# Patient Record
Sex: Male | Born: 1990 | Race: White | Hispanic: No | Marital: Single | Smoking: Former smoker
Health system: Southern US, Community
[De-identification: ages and names within clinical notes are randomized; demographics above are authoritative.]

## PROBLEM LIST (undated history)

## (undated) DIAGNOSIS — J342 Deviated nasal septum: Secondary | ICD-10-CM

## (undated) HISTORY — PX: SHOULDER SURGERY: SHX246

## (undated) HISTORY — PX: NASAL SEPTUM SURGERY: SHX37

## (undated) HISTORY — PX: HERNIA REPAIR: SHX51

---

## 2012-11-06 ENCOUNTER — Emergency Department: Payer: Self-pay | Admitting: Emergency Medicine

## 2012-11-06 LAB — COMPREHENSIVE METABOLIC PANEL
Albumin: 4.4 g/dL (ref 3.8–5.6)
BUN: 17 mg/dL (ref 7–18)
Calcium, Total: 9.1 mg/dL (ref 9.0–10.7)
Co2: 26 mmol/L (ref 21–32)
Creatinine: 1.01 mg/dL (ref 0.60–1.30)
EGFR (Non-African Amer.): >60
Glucose: 106 mg/dL — ABNORMAL HIGH (ref 65–99)
Osmolality: 285 (ref 275–301)
Potassium: 3.9 mmol/L (ref 3.5–5.1)
Sodium: 142 mmol/L (ref 136–145)
Total Protein: 7.7 g/dL (ref 6.4–8.6)

## 2012-11-06 LAB — CBC
HCT: 46.3 % (ref 40.0–52.0)
MCH: 29.2 pg (ref 26.0–34.0)
MCHC: 33.1 g/dL (ref 32.0–36.0)
Platelet: 169 10*3/uL (ref 150–440)
RBC: 5.25 10*6/uL (ref 4.40–5.90)
RDW: 13.1 % (ref 11.5–14.5)
WBC: 12.3 10*3/uL — ABNORMAL HIGH (ref 3.8–10.6)

## 2012-11-06 LAB — URINALYSIS, COMPLETE
Bacteria: NONE SEEN
Bilirubin,UR: NEGATIVE
Blood: NEGATIVE
Glucose,UR: NEGATIVE mg/dL (ref 0–75)
Ketone: NEGATIVE
Leukocyte Esterase: NEGATIVE
Nitrite: NEGATIVE
Protein: NEGATIVE
RBC,UR: <1 /HPF (ref 0–5)
Specific Gravity: 1.024 (ref 1.003–1.030)

## 2012-11-06 LAB — LIPASE, BLOOD: Lipase: 103 U/L (ref 73–393)

## 2013-02-13 ENCOUNTER — Emergency Department: Payer: Self-pay | Admitting: Emergency Medicine

## 2013-02-13 LAB — COMPREHENSIVE METABOLIC PANEL
Calcium, Total: 8.9 mg/dL — ABNORMAL LOW (ref 9.0–10.7)
Chloride: 104 mmol/L (ref 98–107)
Co2: 28 mmol/L (ref 21–32)
EGFR (Non-African Amer.): >60
Glucose: 88 mg/dL (ref 65–99)
Potassium: 3.9 mmol/L (ref 3.5–5.1)
SGOT(AST): 19 U/L (ref 10–41)
SGPT (ALT): 16 U/L (ref 12–78)
Total Protein: 8 g/dL (ref 6.4–8.6)

## 2013-02-13 LAB — CBC
MCH: 30.6 pg (ref 26.0–34.0)
MCHC: 35 g/dL (ref 32.0–36.0)
MCV: 87 fL (ref 80–100)
RBC: 4.82 10*6/uL (ref 4.40–5.90)
RDW: 13 % (ref 11.5–14.5)
WBC: 7.6 10*3/uL (ref 3.8–10.6)

## 2013-02-13 LAB — URINALYSIS, COMPLETE
Bacteria: NONE SEEN
Nitrite: NEGATIVE
Ph: 5 (ref 4.5–8.0)
Specific Gravity: 1.026 (ref 1.003–1.030)
Squamous Epithelial: NONE SEEN

## 2014-12-12 ENCOUNTER — Ambulatory Visit: Payer: Self-pay | Admitting: Specialist

## 2014-12-27 ENCOUNTER — Ambulatory Visit: Payer: Self-pay | Admitting: Otolaryngology

## 2015-02-24 NOTE — Op Note (Signed)
PATIENT NAME:  Ronald Melendez, Ronald Melendez MR#:  [id number] DATE OF BIRTH:  [birth year]  DATE OF PROCEDURE:  12/27/2014  PREOPERATIVE DIAGNOSIS:  Nasal obstruction, septal deviation, bilateral inferior turbinate hypertrophy.   POSTOPERATIVE DIAGNOSIS:  DIAGNOSIS:  Nasal obstruction, septal deviation, bilateral inferior turbinate hypertrophy.   PROCEDURE PERFORMED:   1.  Septoplasty.  2.  Bilateral inferior turbinate reduction via resection of tissue.   SURGEON: Bud Facereighton Sydelle Sherfield, M.D.   ANESTHESIA: General endotracheal anesthesia.   ESTIMATED BLOOD LOSS: 25 mL.   IV FLUIDS: Please see anesthesia record.   COMPLICATIONS: None.   DRAINS AND STENT PLACEMENTS: Bilateral septal splints and Stammberger Sinu-Foam.   SPECIMENS: None.   COMPLICATIONS:  None.   INDICATIONS FOR PROCEDURE: The patient is a 24 year old male with a history of chronic nasal obstruction and frequent sinus infections, found to have severe left-sided septal deviation as well as severe bilateral inferior turbinate hypertrophy.   OPERATIVE FINDINGS: Severe left-sided septal deviation with impaction of cartilaginous and bony deviation on the inferior turbinate on the patient's left side with bilateral inferior turbinate soft tissue and bone hypertrophy.   DESCRIPTION OF PROCEDURE: The patient was identified in holding. Benefits and risks of the procedure were discussed and consent was reviewed. The patient was taken to the operating room and placed in the supine position. General endotracheal anesthesia was induced. The patient was rotated 90 degrees. 9 mL of 1% lidocaine with 1:100,000 epinephrine were injected into the anterior septum bilaterally, anterior columella, and anterior/inferior turbinate. Afrin-soaked pledgets were placed in the patient's nasal cavity and he was prepped and draped in a sterile fashion. A 0 degree endoscope was brought into the field and visualization in the patient's nasal cavity was made. This demonstrated a  bony and cartilaginous left-sided septal deviation extending all the way down to the maxillary crest, more posteriorly there was a severe bony deviation with impaction on the left inferior turbinate with deformation of the left inferior turbinate from bony impaction over time.  The right nasal cavity revealed significant right-sided inferior turbinate hypertrophy, bony as well as soft tissue. At this time a 15 blade scalpel was used to make a Killian incision of the left anterior septum. A mucoperichondrial flap was elevated on the left side past the bony cartilaginous junction. This demonstrated a significant left-sided bony and cartilaginous spur as well as bony deviation more posteriorly. A small drain hole was placed at the apex of the bony deviation. An intercartilaginous incision was made with a Therapist, nutritionalreer elevator and a contralateral mucoperichondrial flap was elevated as well. The area of the cartilaginous deviation was removed with Lenoria Chimeakahashi forceps once it was fully separated from bilateral mucoperichondrial flaps as well as posterior bony cartilaginous junction. At this time Gruenwald forceps were used to remove the bony deviation. There was also an area of cartilaginous deviation on top of the bony deviation more posteriorly as well. This was removed with small biting forceps. Exceeding care was taken to avoid a contralateral mucoperichondrial rent and this was successful. At this time endoscopic visualization of the patient's nasal cavity revealed good resolution of the bony and cartilaginous deviation on the patient's left side and attention was directed to the patient's inferior turbinates.   At this time the patient's inferior turbinates were infractured bilaterally using a Therapist, nutritionalreer elevator. A Kelly clamp was attached to the anterior/inferior third of each inferior turbinate for approximately 1 minute. After 1 minute under endoscopic visualization using Gruenwald forceps the anterior/inferior third of  each inferior turbinate was resected and  then the cut edge was cauterized with the Bovie electrocautery. After hemostasis was achieved the inferior turbinates were infractured bilaterally using a Therapist, nutritional. At this time meticulous hemostasis was insured and the [place] incision was closed in an interrupted fashion using 4-0 Chromic suture, and Stammberger Sinu-Foam was placed along the cut edge of the inferior turbinates bilaterally, and then standard size septal splints were placed bilaterally in the nasal cavity and held in place with a single 2-0 nylon suture. At this time care of the patient was transferred to anesthesia.      ____________________________ Kyung Rudd, MD ccv:bu D: 12/27/2014 08:40:24 ET T: 12/27/2014 18:14:47 ET JOB#: [id number]  cc: Kyung Rudd, MD, <Dictator> Kyung Rudd MD ELECTRONICALLY SIGNED 01/16/2015 9:56

## 2015-07-13 ENCOUNTER — Emergency Department
Admission: EM | Admit: 2015-07-13 | Discharge: 2015-07-13 | Disposition: A | Discharge status: Home / Self Care | Payer: Managed Care, Other (non HMO) | Attending: Emergency Medicine | Admitting: Emergency Medicine

## 2015-07-13 ENCOUNTER — Encounter: Payer: Self-pay | Admitting: Emergency Medicine

## 2015-07-13 ENCOUNTER — Emergency Department: Payer: Managed Care, Other (non HMO)

## 2015-07-13 DIAGNOSIS — Z87891 Personal history of nicotine dependence: Secondary | ICD-10-CM | POA: Insufficient documentation

## 2015-07-13 DIAGNOSIS — Y998 Other external cause status: Secondary | ICD-10-CM | POA: Diagnosis not present

## 2015-07-13 DIAGNOSIS — S4991XA Unspecified injury of right shoulder and upper arm, initial encounter: Secondary | ICD-10-CM | POA: Diagnosis present

## 2015-07-13 DIAGNOSIS — Y92219 Unspecified school as the place of occurrence of the external cause: Secondary | ICD-10-CM | POA: Diagnosis not present

## 2015-07-13 DIAGNOSIS — Y93B2 Activity, push-ups, pull-ups, sit-ups: Secondary | ICD-10-CM | POA: Diagnosis not present

## 2015-07-13 DIAGNOSIS — S42151A Displaced fracture of neck of scapula, right shoulder, initial encounter for closed fracture: Secondary | ICD-10-CM

## 2015-07-13 DIAGNOSIS — W1789XA Other fall from one level to another, initial encounter: Secondary | ICD-10-CM | POA: Insufficient documentation

## 2015-07-13 DIAGNOSIS — S42141A Displaced fracture of glenoid cavity of scapula, right shoulder, initial encounter for closed fracture: Secondary | ICD-10-CM | POA: Diagnosis not present

## 2015-07-13 HISTORY — DX: Deviated nasal septum: J34.2

## 2015-07-13 MED ORDER — OXYCODONE-ACETAMINOPHEN 5-325 MG PO TABS: 1.0000 | ORAL_TABLET | Freq: Four times a day (QID) | ORAL | Status: AC | PRN

## 2015-07-13 MED ORDER — NAPROXEN 500 MG PO TABS: 500.0000 mg | ORAL_TABLET | Freq: Two times a day (BID) | ORAL | Status: AC

## 2015-07-13 MED ORDER — HYDROCODONE-ACETAMINOPHEN 5-325 MG PO TABS: 1.0000 | ORAL_TABLET | ORAL | Status: DC | PRN

## 2015-07-13 NOTE — ED Notes (Signed)
Pt states he was doing pull ups at school yesterday, states the bar fell, and he landed on his right shoulder. Has torn left shoulder and this feels worse than that.

## 2015-07-13 NOTE — ED Provider Notes (Signed)
CSN: [id number]     Arrival date & time 07/13/15  1229 History   First MD Initiated Contact with Patient 07/13/15 1252     Chief Complaint  Patient presents with  . Shoulder Pain     HPI Comments: 24 year old male presents today complaining of right shoulder pain secondary to injury that occurred yesterday at his home. Pt reports he was doing pullups on a bar that is in his door frame when the door frame gave way causing the pullup bar to fall. Consequently, he fell directly onto his right shoulder. He has a known rotator cuff tear in his right arm that is minor per his report. This was diagnosed several years ago and he has not had any problems with it in the interim - lifts weight regularly. Has also had left shoulder surgery for injury in the past. Attends school at [employer], [place] of [place].   Patient is a 24 y.o. male presenting with shoulder pain. The history is provided by the patient.  Shoulder Pain Location:  Shoulder Time since incident:  12 hours Injury: yes   Mechanism of injury: fall   Fall:    Fall occurred: from pull up bar.   Height of fall:  5-6 feet   Impact surface:  Carpet Shoulder location:  R shoulder Pain details:    Quality:  Aching   Radiates to:  Does not radiate   Severity:  Moderate   Timing:  Constant   Progression:  Unchanged Handedness:  Right-handed Dislocation: no   Tetanus status:  Up to date Prior injury to area:  Yes Worsened by:  Movement Ineffective treatments:  None tried Associated symptoms: decreased range of motion and stiffness   Associated symptoms: no muscle weakness, no neck pain and no swelling     Past Medical History  Diagnosis Date  . Deviated septum    Past Surgical History  Procedure Laterality Date  . Shoulder surgery      left   . Nasal septum surgery    . Hernia repair     No family history on file. Social History  Substance Use Topics  . Smoking status: Former Smoker    Types: Cigarettes  . Smokeless tobacco:  None  . Alcohol Use: Yes     Comment: occasionally    Review of Systems  Musculoskeletal: Positive for myalgias, arthralgias and stiffness. Negative for neck pain and neck stiffness.  Skin: Negative for wound.  All other systems reviewed and are negative.     Allergies  Review of patient's allergies indicates no known allergies.  Home Medications   Prior to Admission medications   Medication Sig Start Date End Date Taking? Authorizing Provider  HYDROcodone-acetaminophen (NORCO/VICODIN) 5-325 MG per tablet Take 1 tablet by mouth every 4 (four) hours as needed for moderate pain. 07/13/15   Luvenia Redden, PA-C  naproxen (NAPROSYN) 500 MG tablet Take 1 tablet (500 mg total) by mouth 2 (two) times daily with a meal. 07/13/15 07/12/16  Wilber Oliphant V, PA-C   BP 123/80 mmHg  Pulse 79  Temp(Src) 97.8 F (36.6 C) (Oral)  Resp 18  Ht  (1.702 m)  Wt 160 lb (72.576 kg)  BMI 25.05 kg/m2  SpO2 100% Physical Exam  Constitutional: He is oriented to person, place, and time. Vital signs are normal. He appears well-developed and well-nourished.  HENT:  Head: Normocephalic and atraumatic.  Neck: Normal range of motion.  Musculoskeletal: He exhibits tenderness.  Right shoulder: He exhibits decreased range of motion, tenderness, bony tenderness and pain. He exhibits no swelling, no deformity, no laceration, normal pulse and normal strength.       Arms: Limited abduction due to pain. Pain at 90 degrees of abduction. Able to adduct fully. Negative drop arm test. Negative impingement sign. Equivocal lift off test. Full flexion and extension.   Neurological: He is alert and oriented to person, place, and time.  Skin: Skin is warm and dry.  Psychiatric: He has a normal mood and affect. His behavior is normal. Judgment and thought content normal.  Nursing note and vitals reviewed.   ED Course  Procedures (including critical care time) Labs Review Labs Reviewed - No data to  display  Imaging Review Dg Shoulder Right  07/13/2015   CLINICAL DATA:  Patient fell while doing pull-ups yesterday and landed on right shoulder, pain  EXAM: RIGHT SHOULDER - 2+ VIEW  COMPARISON:  None.  FINDINGS: No glenohumeral dislocation. Humerus is intact. Subtle cortical irregularity along the inferior surface of the glenoid could represent a subtle fracture.  IMPRESSION: Possible subtle fracture of the inferior glenoid.   Electronically Signed   By: Esperanza Heir M.D.   On: 07/13/2015 13:45   I have personally reviewed and evaluated these images and lab results as part of my medical decision-making.   EKG Interpretation None      MDM  Sling applied in ER. Naproxen BId with food. norco as needed for severe pain, do not take at bedtime. Call ortho Monday for follow up - Dr. Francesco Sor information given to patient  Final diagnoses:  Glenoid fracture of shoulder, right, closed, initial encounter       Luvenia Redden, PA-C 07/13/15 1356  Emily Filbert, MD 07/13/15 [phone]

## 2015-11-26 ENCOUNTER — Emergency Department
Admission: EM | Admit: 2015-11-26 | Discharge: 2015-11-26 | Disposition: A | Discharge status: Home / Self Care | Payer: Managed Care, Other (non HMO) | Attending: Student | Admitting: Student

## 2015-11-26 ENCOUNTER — Encounter: Payer: Self-pay | Admitting: Emergency Medicine

## 2015-11-26 DIAGNOSIS — R109 Unspecified abdominal pain: Secondary | ICD-10-CM | POA: Diagnosis not present

## 2015-11-26 DIAGNOSIS — Z87891 Personal history of nicotine dependence: Secondary | ICD-10-CM | POA: Diagnosis not present

## 2015-11-26 DIAGNOSIS — R197 Diarrhea, unspecified: Secondary | ICD-10-CM

## 2015-11-26 DIAGNOSIS — R112 Nausea with vomiting, unspecified: Secondary | ICD-10-CM | POA: Insufficient documentation

## 2015-11-26 DIAGNOSIS — R1110 Vomiting, unspecified: Secondary | ICD-10-CM

## 2015-11-26 DIAGNOSIS — Z791 Long term (current) use of non-steroidal anti-inflammatories (NSAID): Secondary | ICD-10-CM | POA: Insufficient documentation

## 2015-11-26 LAB — COMPREHENSIVE METABOLIC PANEL
ALT: 24 U/L (ref 17–63)
AST: 23 U/L (ref 15–41)
Albumin: 4.8 g/dL (ref 3.5–5.0)
Alkaline Phosphatase: 73 U/L (ref 38–126)
Anion gap: 8 (ref 5–15)
BILIRUBIN TOTAL: 1.6 mg/dL — AB (ref 0.3–1.2)
BUN: 15 mg/dL (ref 6–20)
CHLORIDE: 103 mmol/L (ref 101–111)
CO2: 23 mmol/L (ref 22–32)
Calcium: 9 mg/dL (ref 8.9–10.3)
Creatinine, Ser: 0.98 mg/dL (ref 0.61–1.24)
Glucose, Bld: 114 mg/dL — ABNORMAL HIGH (ref 65–99)
POTASSIUM: 3.9 mmol/L (ref 3.5–5.1)
Sodium: 134 mmol/L — ABNORMAL LOW (ref 135–145)
TOTAL PROTEIN: 7.9 g/dL (ref 6.5–8.1)

## 2015-11-26 LAB — CBC WITH DIFFERENTIAL/PLATELET
BASOS ABS: 0 10*3/uL (ref 0–0.1)
Basophils Relative: 0 %
EOS PCT: 1 %
Eosinophils Absolute: 0.1 10*3/uL (ref 0–0.7)
HEMATOCRIT: 50.4 % (ref 40.0–52.0)
Hemoglobin: 17.1 g/dL (ref 13.0–18.0)
LYMPHS ABS: 0.8 10*3/uL — AB (ref 1.0–3.6)
LYMPHS PCT: 6 %
MCH: 30.3 pg (ref 26.0–34.0)
MCHC: 34 g/dL (ref 32.0–36.0)
MCV: 89 fL (ref 80.0–100.0)
MONO ABS: 0.6 10*3/uL (ref 0.2–1.0)
MONOS PCT: 5 %
NEUTROS ABS: 10.6 10*3/uL — AB (ref 1.4–6.5)
Neutrophils Relative %: 88 %
PLATELETS: 214 10*3/uL (ref 150–440)
RBC: 5.66 MIL/uL (ref 4.40–5.90)
RDW: 13 % (ref 11.5–14.5)
WBC: 12.1 10*3/uL — ABNORMAL HIGH (ref 3.8–10.6)

## 2015-11-26 LAB — LIPASE, BLOOD: LIPASE: 17 U/L (ref 11–51)

## 2015-11-26 MED ORDER — SODIUM CHLORIDE 0.9 % IV BOLUS (SEPSIS)
1000.0000 mL | Freq: Once | INTRAVENOUS | Status: AC
  Administered 2015-11-26: 1000 mL via INTRAVENOUS

## 2015-11-26 MED ORDER — ONDANSETRON HCL 4 MG/2ML IJ SOLN
4.0000 mg | Freq: Once | INTRAMUSCULAR | Status: AC
  Administered 2015-11-26: 4 mg via INTRAVENOUS
  Filled 2015-11-26: qty 2

## 2015-11-26 MED ORDER — ONDANSETRON 4 MG PO TBDP: 4.0000 mg | ORAL_TABLET | Freq: Three times a day (TID) | ORAL | Status: AC | PRN

## 2015-11-26 NOTE — ED Notes (Signed)
Pt to ed with c/o abd pain, n/v/d x 3 days.

## 2015-11-26 NOTE — ED Notes (Signed)
Pt informed to return if any life threatening symptoms occur.  

## 2015-11-26 NOTE — ED Provider Notes (Signed)
[facility] Emergency Department Provider Note  ____________________________________________  Time seen: Approximately 8:56 AM  I have reviewed the triage vital signs and the nursing notes.   HISTORY  Chief Complaint Emesis    HPI Ronald Melendez is a 25 y.o. male no chronic medical problems who presents for evaluation of 2 episodes recurrent nonbloody nonbilious emesis which began yesterday evening, gradual onset, constant since onset, initially severe and now moderate, no modifying factors. The patient reports that 2 days ago he began having nonbloody diarrhea. His friend was ill with vomiting and diarrhea at that time. He has had no fevers. He has had abdominal cramping. No pain or burning with urination. No chest pain or difficulty breathing.   Past Medical History  Diagnosis Date  . Deviated septum     There are no active problems to display for this patient.   Past Surgical History  Procedure Laterality Date  . Shoulder surgery      left   . Nasal septum surgery    . Hernia repair      Current Outpatient Rx  Name  Route  Sig  Dispense  Refill  . naproxen (NAPROSYN) 500 MG tablet   Oral   Take 1 tablet (500 mg total) by mouth 2 (two) times daily with a meal.   60 tablet   0   . oxyCODONE-acetaminophen (ROXICET) 5-325 MG per tablet   Oral   Take 1 tablet by mouth every 6 (six) hours as needed.   20 tablet   0     Allergies Review of patient's allergies indicates no known allergies.  History reviewed. No pertinent family history.  Social History Social History  Substance Use Topics  . Smoking status: Former Smoker    Types: Cigarettes  . Smokeless tobacco: None  . Alcohol Use: Yes     Comment: occasionally    Review of Systems Constitutional: No fever/chills Eyes: No visual changes. ENT: No sore throat. Cardiovascular: Denies chest pain. Respiratory: Denies shortness of breath. Gastrointestinal: + abdominal cramping.  +  nausea, + vomiting.  + diarrhea.  No constipation. Genitourinary: Negative for dysuria. Musculoskeletal: Negative for back pain. Skin: Negative for rash. Neurological: Negative for headaches, focal weakness or numbness.  10-point ROS otherwise negative.  ____________________________________________   PHYSICAL EXAM:  VITAL SIGNS: ED Triage Vitals  Enc Vitals Group     BP 11/26/15 0852 106/68 mmHg     Pulse Rate 11/26/15 0852 88     Resp 11/26/15 0852 92     Temp 11/26/15 0852 97.7 F (36.5 C)     Temp Source 11/26/15 0852 Oral     SpO2 11/26/15 0852 100 %     Weight 11/26/15 0852 150 lb (68.04 kg)     Height 11/26/15 0852  (1.727 m)     Head Cir --      Peak Flow --      Pain Score 11/26/15 0852 7     Pain Loc --      Pain Edu? --      Excl. in GC? --     Constitutional: Alert and oriented. Nontoxic appearing and in no acute distress. Eyes: Conjunctivae are normal. PERRL. EOMI. Head: Atraumatic. Nose: No congestion/rhinnorhea. Mouth/Throat: Mucous membranes are moist.  Oropharynx non-erythematous. Neck: No stridor.  Cardiovascular: Normal rate, regular rhythm. Grossly normal heart sounds.  Good peripheral circulation. Respiratory: Normal respiratory effort.  No retractions. Lungs CTAB. Gastrointestinal: Soft and nontender. No distention.  No CVA tenderness.  Genitourinary: deferred Musculoskeletal: No lower extremity tenderness nor edema.  No joint effusions. Neurologic:  Normal speech and language. No gross focal neurologic deficits are appreciated. No gait instability. Skin:  Skin is warm, dry and intact. No rash noted. Psychiatric: Mood and affect are normal. Speech and behavior are normal.  ____________________________________________   LABS (all labs ordered are listed, but only abnormal results are displayed)  Labs Reviewed  CBC WITH DIFFERENTIAL/PLATELET - Abnormal; Notable for the following:    WBC 12.1 (*)    Neutro Abs 10.6 (*)    Lymphs Abs 0.8  (*)    All other components within normal limits  COMPREHENSIVE METABOLIC PANEL - Abnormal; Notable for the following:    Sodium 134 (*)    Glucose, Bld 114 (*)    Total Bilirubin 1.6 (*)    All other components within normal limits  LIPASE, BLOOD   ____________________________________________  EKG  none ____________________________________________  RADIOLOGY  none ____________________________________________   PROCEDURES  Procedure(s) performed: None  Critical Care performed: No  ____________________________________________   INITIAL IMPRESSION / ASSESSMENT AND PLAN / ED COURSE  Pertinent labs & imaging results that were available during my care of the patient were reviewed by me and considered in my medical decision making (see chart for details).  Ronald Melendez is a 25 y.o. male no chronic medical problems who presents for evaluation of 2 episodes recurrent nonbloody nonbilious emesis which began yesterday evening, gradual onset, constant since onset, initially severe and now moderate, no modifying factors. On exam, he is nontoxic appearing and in no acute distress. Vital signs stable, he is afebrile. He has a benign abdominal examination. Suspect his symptoms may be secondary to viral syndrome given sick contacts with similar symptoms. Plan for screening labs, we'll give IV fluids as well as antiemetics. Reassess for disposition.  ----------------------------------------- 11:02 AM on 11/26/2015 -----------------------------------------  Labs reviewed and are notable for mild leukocytosis with Skarda blood cell count 12.1. CMP with mild T bili elevation which I suspect is secondary to his vomiting. Lipase is unremarkable. We'll not check urinalysis given that he has no urinary complaints. He is sitting up in bed, tolerating by mouth intake. We discussed return precautions, need for close PCP follow-up and he is comfortable with the discharge plan. DC with  Zofran. ____________________________________________   FINAL CLINICAL IMPRESSION(S) / ED DIAGNOSES  Final diagnoses:  Vomiting and diarrhea      Gayla Doss, MD 11/26/15 1103

## 2015-11-26 NOTE — ED Notes (Signed)
Pt has drank 1 glass of water and has not vomited or felt nauseous.

## 2016-02-05 IMAGING — MR MRI OF THE RIGHT KNEE WITHOUT CONTRAST
6 series · 39 of 40 positions shown · non-contrast
Comparison: None.

CLINICAL DATA: Right knee pain for 3 months. Injured while cutting
trees.

EXAM:
MRI OF THE RIGHT KNEE WITHOUT CONTRAST
TECHNIQUE: Multiplanar, multisequence MR imaging of the knee was performed. No
intravenous contrast was administered.

[Series 3: PD fat-sat · axial · 3.0mm · 0.50mm/px · z∈[-80,+45]mm · 9 of 39 slices shown (1 of 4)]
[im 1/39]
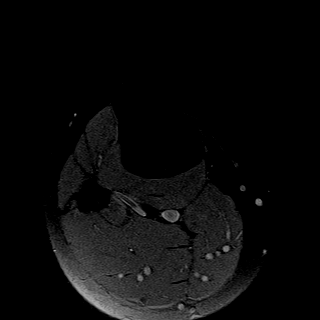
[im 5/39]
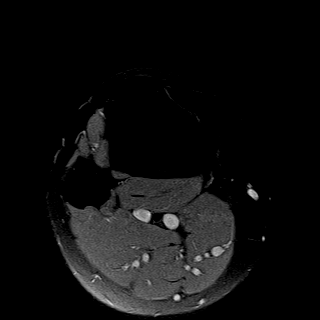
[im 10/39]
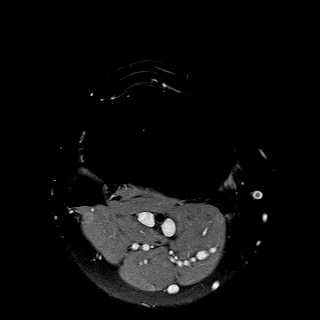
[im 15/39]
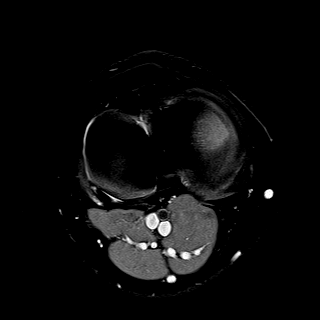
[im 20/39]
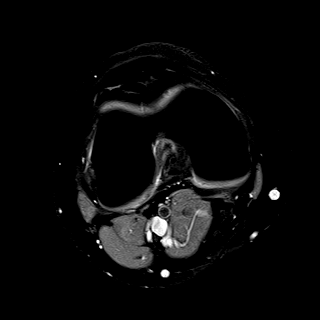
[im 24/39]
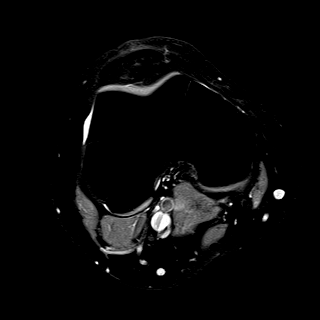
[im 29/39]
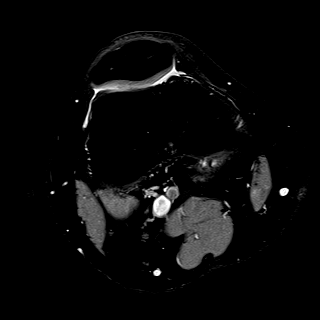
[im 34/39]
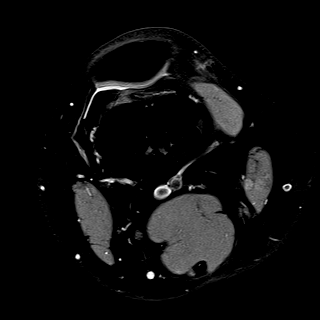
[im 39/39]
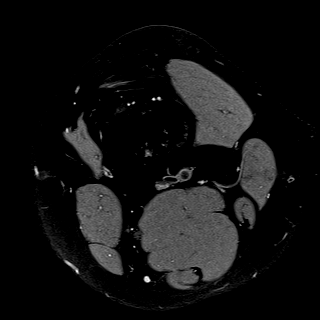

[Series 4: T1 · coronal · 3.0mm · 0.50mm/px · 6 of 33 slices shown]
[im 1/33]
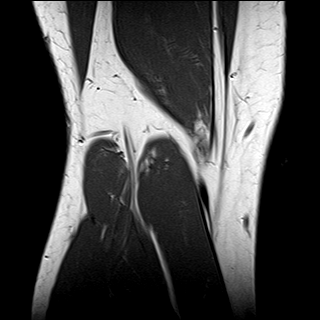
[im 6/33]
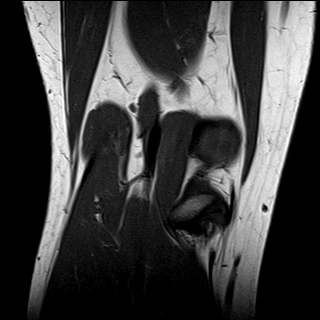
[im 11/33]
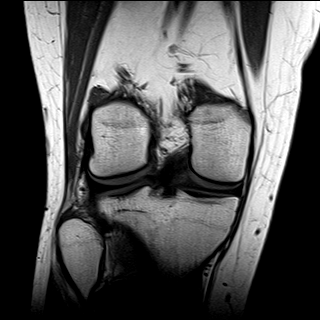
[im 17/33]
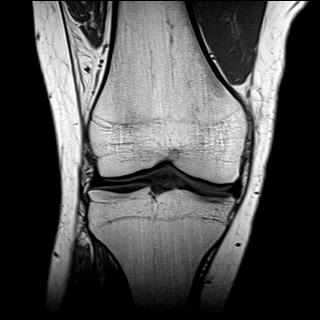
[im 22/33]
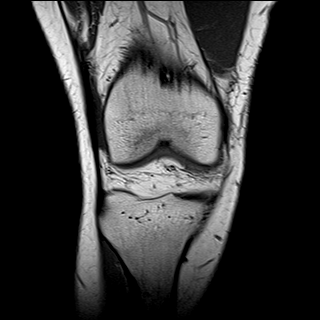
[im 27/33]
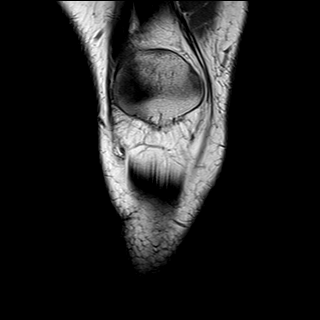

[Series 5: PD fat-sat · sagittal · 3.0mm · 0.62mm/px · 8 of 35 slices shown (2 of 4)]
[im 1/35]
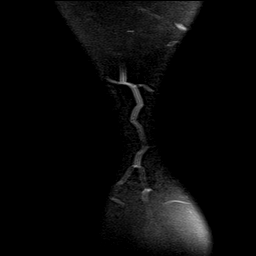
[im 5/35]
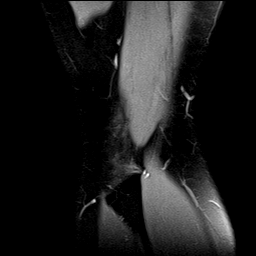
[im 10/35]
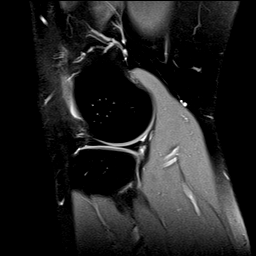
[im 15/35]
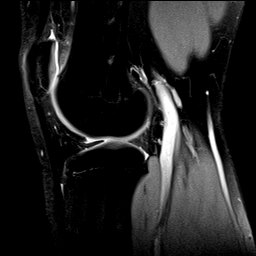
[im 20/35]
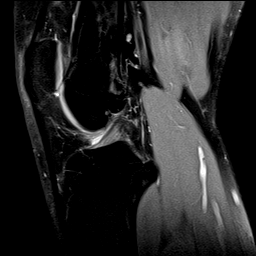
[im 25/35]
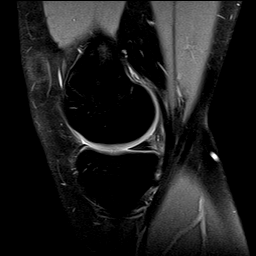
[im 30/35]
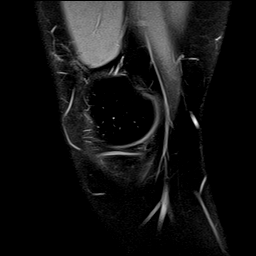
[im 35/35]
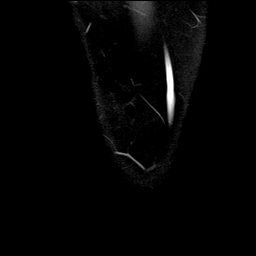

[Series 6: T2 fat-sat · coronal · 3.0mm · 0.31mm/px · 7 of 33 slices shown]
[im 1/33]
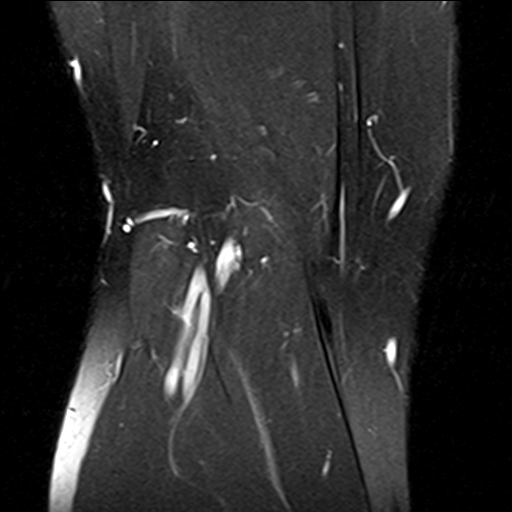
[im 6/33]
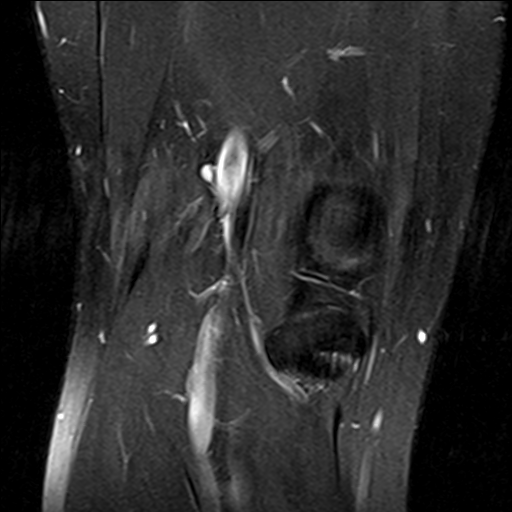
[im 11/33]
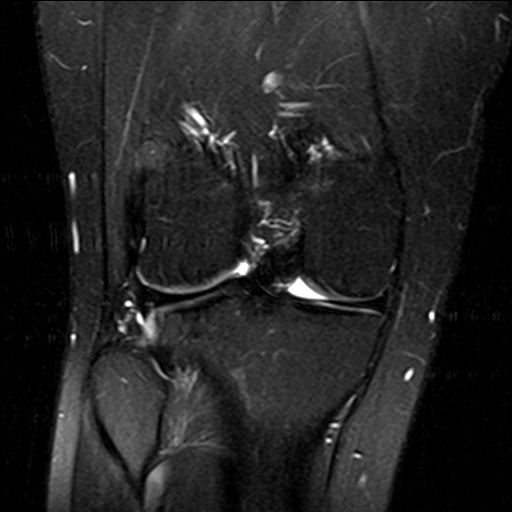
[im 17/33]
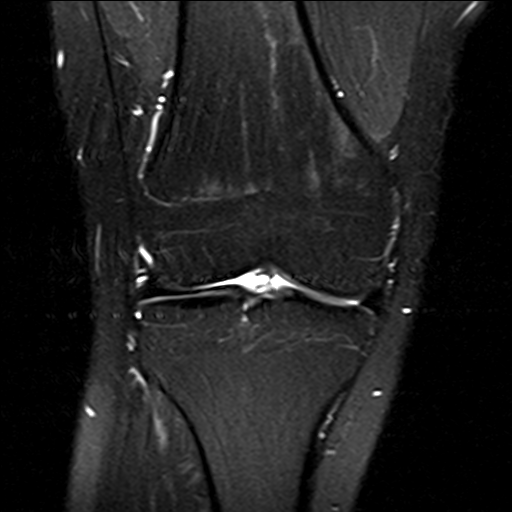
[im 22/33]
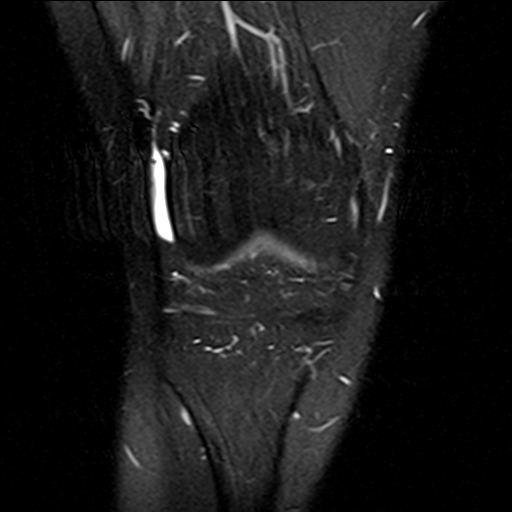
[im 27/33]
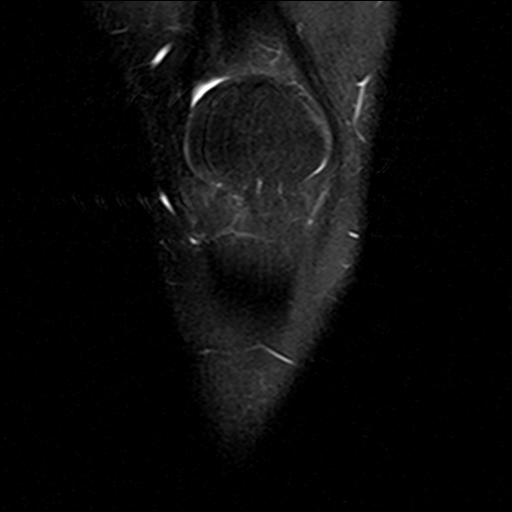
[im 33/33]
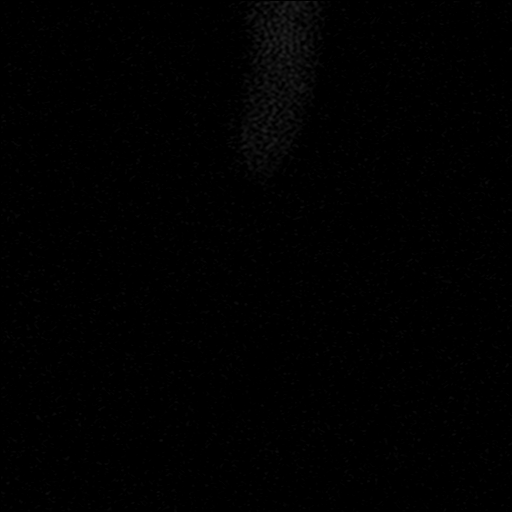

[Series 7: PD fat-sat · coronal · 3.0mm · 0.50mm/px · 7 of 33 slices shown (3 of 4)]
[im 1/33]
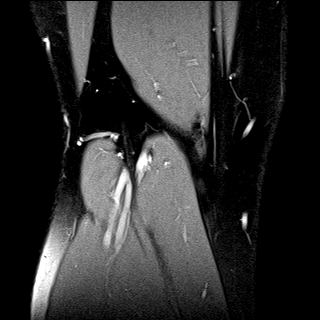
[im 6/33]
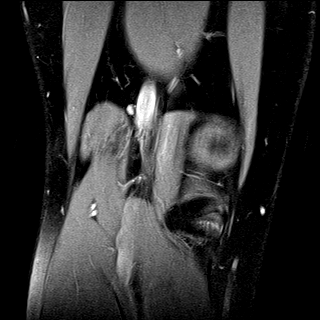
[im 11/33]
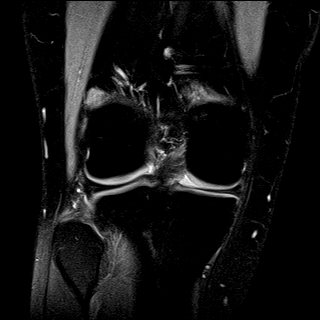
[im 17/33]
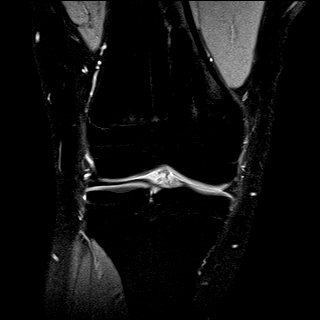
[im 22/33]
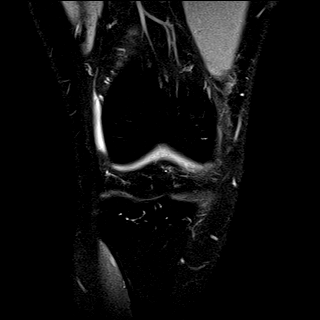
[im 27/33]
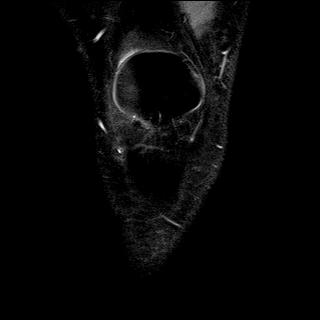
[im 33/33]
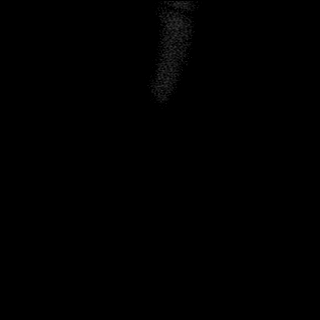

[Series 8: PD fat-sat · oblique · 2.0mm · 0.62mm/px · 2 of 7 slices shown (4 of 4)]
[im 1/7]
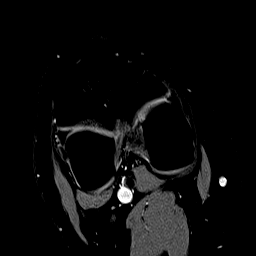
[im 7/7]
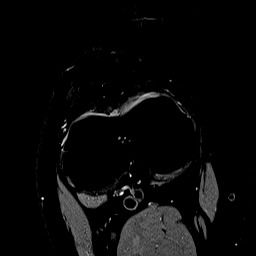

[39 of 40 positions shown; findings below may reference images not displayed]

FINDINGS: MENISCI

Medial meniscus:  Normal.

Lateral meniscus:  Normal.

LIGAMENTS

Cruciates: Intact. Increased intrasubstance T2 signal intensity
could be mild mucoid degeneration or possible ACL sprain.

Collaterals:  Intact.

CARTILAGE

Patellofemoral: Mild Degenerative chondrosis/ chondromalacia
involving the patellar apex.

Medial:  Normal.

Lateral:  Normal.

Joint:  No joint effusion or synovitis.

Popliteal Fossa:  No popliteal mass or Baker's cyst.

Extensor Mechanism: The patella retinacular structures are intact
and the quadriceps and patellar tendons are intact.

Bones: No acute bony findings. No bone contusion, marrow edema or
osteochondral lesion.
IMPRESSION: 1. No acute bony findings and intact ligamentous structures. Signal
abnormality in the ACL could be due to early mucoid degeneration or
ACL sprain.
2. No meniscal tears.
3. Mild chondromalacia at the patellar apex.
4. No joint effusion or Baker's cyst.
